# Patient Record
Sex: Male | Born: 1990 | Race: White | Hispanic: No | Marital: Single | Smoking: Current every day smoker
Health system: Southern US, Community
[De-identification: ages and names within clinical notes are randomized; demographics above are authoritative.]

## PROBLEM LIST (undated history)

## (undated) DIAGNOSIS — J45909 Unspecified asthma, uncomplicated: Secondary | ICD-10-CM

---
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
## 2013-01-11 ENCOUNTER — Emergency Department: Payer: Self-pay | Admitting: Internal Medicine

## 2014-08-16 ENCOUNTER — Emergency Department: Payer: Self-pay | Admitting: Emergency Medicine

## 2016-02-07 ENCOUNTER — Emergency Department
Admission: EM | Admit: 2016-02-07 | Discharge: 2016-02-07 | Disposition: A | Discharge status: Home / Self Care | Payer: No Typology Code available for payment source | Attending: Emergency Medicine | Admitting: Emergency Medicine

## 2016-02-07 ENCOUNTER — Emergency Department: Payer: No Typology Code available for payment source

## 2016-02-07 ENCOUNTER — Encounter: Payer: Self-pay | Admitting: Medical Oncology

## 2016-02-07 DIAGNOSIS — S80812A Abrasion, left lower leg, initial encounter: Secondary | ICD-10-CM | POA: Insufficient documentation

## 2016-02-07 DIAGNOSIS — S301XXA Contusion of abdominal wall, initial encounter: Secondary | ICD-10-CM | POA: Insufficient documentation

## 2016-02-07 DIAGNOSIS — Y9389 Activity, other specified: Secondary | ICD-10-CM | POA: Insufficient documentation

## 2016-02-07 DIAGNOSIS — T07XXXA Unspecified multiple injuries, initial encounter: Secondary | ICD-10-CM

## 2016-02-07 DIAGNOSIS — S7011XA Contusion of right thigh, initial encounter: Secondary | ICD-10-CM | POA: Insufficient documentation

## 2016-02-07 DIAGNOSIS — Y92480 Sidewalk as the place of occurrence of the external cause: Secondary | ICD-10-CM | POA: Insufficient documentation

## 2016-02-07 DIAGNOSIS — F17200 Nicotine dependence, unspecified, uncomplicated: Secondary | ICD-10-CM | POA: Insufficient documentation

## 2016-02-07 DIAGNOSIS — S8001XA Contusion of right knee, initial encounter: Secondary | ICD-10-CM | POA: Insufficient documentation

## 2016-02-07 DIAGNOSIS — S7001XA Contusion of right hip, initial encounter: Secondary | ICD-10-CM | POA: Diagnosis not present

## 2016-02-07 DIAGNOSIS — Y999 Unspecified external cause status: Secondary | ICD-10-CM | POA: Insufficient documentation

## 2016-02-07 DIAGNOSIS — S8991XA Unspecified injury of right lower leg, initial encounter: Secondary | ICD-10-CM | POA: Diagnosis present

## 2016-02-07 MED ORDER — IBUPROFEN 800 MG PO TABS: 800.0000 mg | ORAL_TABLET | Freq: Three times a day (TID) | ORAL | Status: AC | PRN

## 2016-02-07 MED ORDER — BACITRACIN ZINC 500 UNIT/GM EX OINT
TOPICAL_OINTMENT | CUTANEOUS | Status: AC
  Administered 2016-02-07: 1 via TOPICAL
  Filled 2016-02-07: qty 1.8

## 2016-02-07 MED ORDER — CYCLOBENZAPRINE HCL 10 MG PO TABS: 10.0000 mg | ORAL_TABLET | Freq: Three times a day (TID) | ORAL | Status: AC | PRN

## 2016-02-07 MED ORDER — BACITRACIN ZINC 500 UNIT/GM EX OINT
TOPICAL_OINTMENT | Freq: Two times a day (BID) | CUTANEOUS | Status: DC
  Administered 2016-02-07: 1 via TOPICAL

## 2016-02-07 MED ORDER — HYDROMORPHONE HCL 1 MG/ML IJ SOLN
1.0000 mg | Freq: Once | INTRAMUSCULAR | Status: AC
  Administered 2016-02-07: 1 mg via INTRAMUSCULAR
  Filled 2016-02-07: qty 1

## 2016-02-07 MED ORDER — NEOMYCIN-POLYMYXIN-PRAMOXINE 1 % EX CREA: TOPICAL_CREAM | Freq: Two times a day (BID) | CUTANEOUS | Status: AC

## 2016-02-07 MED ORDER — OXYCODONE-ACETAMINOPHEN 7.5-325 MG PO TABS: 1.0000 | ORAL_TABLET | ORAL | Status: AC | PRN
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
## 2016-02-07 NOTE — ED Notes (Signed)
See triage note  Abrasions to hand and chest    Having increased pain to right hip and leg

## 2016-02-07 NOTE — ED Notes (Signed)
Gauze bandages applied to abrasions after Bacitracin application.
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
## 2020-04-12 ENCOUNTER — Emergency Department
Admission: EM | Admit: 2020-04-12 | Discharge: 2020-04-12 | Disposition: A | Discharge status: Home / Self Care | Payer: Self-pay | Attending: Emergency Medicine | Admitting: Emergency Medicine

## 2020-04-12 ENCOUNTER — Other Ambulatory Visit: Payer: Self-pay

## 2020-04-12 DIAGNOSIS — G43909 Migraine, unspecified, not intractable, without status migrainosus: Secondary | ICD-10-CM | POA: Insufficient documentation

## 2020-04-12 DIAGNOSIS — Z5321 Procedure and treatment not carried out due to patient leaving prior to being seen by health care provider: Secondary | ICD-10-CM | POA: Insufficient documentation

## 2020-04-12 NOTE — ED Notes (Signed)
Pt called in waiting room at this time to be taken to room, no response staff unable to locate pt at this time

## 2020-04-12 NOTE — ED Notes (Signed)
Pt called for room, pt not in lobby, or outside.

## 2020-04-12 NOTE — ED Triage Notes (Signed)
Pt comes in POV for migraine since last week. Has tried tylenol, ibuprofen, BC powder, oxycodone, and mom's migraine meds. Went to [facility] and they rx for sinus infection but has not had any relief. Pain all over.

## 2024-03-17 ENCOUNTER — Emergency Department
Admission: EM | Admit: 2024-03-17 | Discharge: 2024-03-17 | Disposition: A | Discharge status: Home / Self Care | Payer: Self-pay | Attending: Emergency Medicine | Admitting: Emergency Medicine

## 2024-03-17 ENCOUNTER — Emergency Department: Payer: Self-pay

## 2024-03-17 ENCOUNTER — Other Ambulatory Visit: Payer: Self-pay

## 2024-03-17 DIAGNOSIS — J069 Acute upper respiratory infection, unspecified: Secondary | ICD-10-CM | POA: Insufficient documentation

## 2024-03-17 DIAGNOSIS — Z720 Tobacco use: Secondary | ICD-10-CM | POA: Insufficient documentation

## 2024-03-17 DIAGNOSIS — J4521 Mild intermittent asthma with (acute) exacerbation: Secondary | ICD-10-CM | POA: Insufficient documentation

## 2024-03-17 HISTORY — DX: Unspecified asthma, uncomplicated: J45.909

## 2024-03-17 LAB — COMPREHENSIVE METABOLIC PANEL WITH GFR
ALT: 10 U/L (ref 0–44)
AST: 21 U/L (ref 15–41)
Albumin: 4.3 g/dL (ref 3.5–5.0)
Alkaline Phosphatase: 68 U/L (ref 38–126)
Anion gap: 7 (ref 5–15)
BUN: 16 mg/dL (ref 6–20)
CO2: 24 mmol/L (ref 22–32)
Calcium: 9.3 mg/dL (ref 8.9–10.3)
Chloride: 106 mmol/L (ref 98–111)
Creatinine, Ser: 0.83 mg/dL (ref 0.61–1.24)
GFR, Estimated: >60 mL/min (ref >60)
Glucose, Bld: 94 mg/dL (ref 70–99)
Potassium: 5 mmol/L (ref 3.5–5.1)
Sodium: 137 mmol/L (ref 135–145)
Total Bilirubin: 1.6 mg/dL — ABNORMAL HIGH (ref 0.0–1.2)
Total Protein: 7.7 g/dL (ref 6.5–8.1)

## 2024-03-17 LAB — CBC
HCT: 41.2 % (ref 39.0–52.0)
Hemoglobin: 14.3 g/dL (ref 13.0–17.0)
MCH: 31.1 pg (ref 26.0–34.0)
MCHC: 34.7 g/dL (ref 30.0–36.0)
MCV: 89.6 fL (ref 80.0–100.0)
Platelets: 256 10*3/uL (ref 150–400)
RBC: 4.6 MIL/uL (ref 4.22–5.81)
RDW: 11.9 % (ref 11.5–15.5)
WBC: 6.3 10*3/uL (ref 4.0–10.5)
nRBC: 0 % (ref 0.0–0.2)

## 2024-03-17 MED ORDER — PREDNISONE 20 MG PO TABS
60.0000 mg | ORAL_TABLET | Freq: Once | ORAL | Status: AC
  Administered 2024-03-17: 60 mg via ORAL
  Filled 2024-03-17: qty 3

## 2024-03-17 MED ORDER — DOXYCYCLINE HYCLATE 100 MG PO TABS
100.0000 mg | ORAL_TABLET | Freq: Once | ORAL | Status: AC
  Administered 2024-03-17: 100 mg via ORAL
  Filled 2024-03-17: qty 1

## 2024-03-17 MED ORDER — IPRATROPIUM-ALBUTEROL 0.5-2.5 (3) MG/3ML IN SOLN
6.0000 mL | Freq: Once | RESPIRATORY_TRACT | Status: AC
  Administered 2024-03-17: 6 mL via RESPIRATORY_TRACT
  Filled 2024-03-17: qty 6

## 2024-03-17 MED ORDER — DOXYCYCLINE MONOHYDRATE 100 MG PO TABS: 100.0000 mg | ORAL_TABLET | Freq: Two times a day (BID) | ORAL | 0 refills | Status: AC

## 2024-03-17 MED ORDER — PREDNISONE 50 MG PO TABS: 50.0000 mg | ORAL_TABLET | Freq: Every day | ORAL | 0 refills | Status: AC

## 2024-03-17 MED ORDER — ALBUTEROL SULFATE HFA 108 (90 BASE) MCG/ACT IN AERS: 2.0000 | INHALATION_SPRAY | Freq: Four times a day (QID) | RESPIRATORY_TRACT | 2 refills | Status: AC | PRN

## 2024-03-17 NOTE — ED Provider Notes (Signed)
 [facility] Provider Note    Event Date/Time   First MD Initiated Contact with Patient 03/17/24 1547     (approximate)   History   Cough   HPI  Brandon Beasley is a 33 y.o. male past medical history significant for asthma, tobacco use, who presents to the emergency department with shortness of breath.  States that for the past 1 week he has been having shortness of breath and feeling like he cannot catch his breath.  Using his sisters inhaler without significant improvement of symptoms.  Denies fever or chills.  Denies nausea or vomiting.  Does endorse a cough over the past 1 week that has been nonproductive.  No history of DVT or PE.  Endorses daily tobacco use.  Does not have his own inhaler.     Physical Exam   Triage Vital Signs: ED Triage Vitals [03/17/24 1403]  Encounter Vitals Group     BP (!) 133/91     Girls Systolic BP Percentile      Girls Diastolic BP Percentile      Boys Systolic BP Percentile      Boys Diastolic BP Percentile      Pulse Rate 96     Resp 17     Temp 98.5 F (36.9 C)     Temp Source Oral     SpO2 97 %     Weight 126 lb (57.2 kg)     Height 6' (1.829 m)     Head Circumference      Peak Flow      Pain Score 0     Pain Loc      Pain Education      Exclude from Growth Chart     Most recent vital signs: Vitals:   03/17/24 1403  BP: (!) 133/91  Pulse: 96  Resp: 17  Temp: 98.5 F (36.9 C)  SpO2: 97%    Physical Exam Constitutional:      Appearance: He is well-developed.  HENT:     Head: Atraumatic.   Eyes:     Conjunctiva/sclera: Conjunctivae normal.    Cardiovascular:     Rate and Rhythm: Regular rhythm.     Pulses: Normal pulses.  Pulmonary:     Effort: Respiratory distress present.     Breath sounds: Wheezing present.     Comments: Speaking in 3 word sentences.  Diffuse inspiratory and expiratory wheezing throughout all lung fields. Abdominal:     Tenderness: There is no abdominal  tenderness.   Musculoskeletal:     Cervical back: Normal range of motion.     Right lower leg: No edema.     Left lower leg: No edema.   Skin:    General: Skin is warm.     Capillary Refill: Capillary refill takes less than 2 seconds.   Neurological:     General: No focal deficit present.     Mental Status: He is alert. Mental status is at baseline.      IMPRESSION / MDM / ASSESSMENT AND PLAN / ED COURSE  I reviewed the triage vital signs and the nursing notes.  Differential diagnosis including asthma exacerbation, pneumonia, atypical pneumonia, anemia.  Low risk Wells criteria and PERC negative have low suspicion for pulmonary embolism.   RADIOLOGY I independently reviewed imaging, my interpretation of imaging: Chest x-ray with no focal findings consistent with pneumonia.   Labs (all labs ordered are listed, but only abnormal results are displayed) Labs interpreted as -  Labs Reviewed  COMPREHENSIVE METABOLIC PANEL WITH GFR - Abnormal; Notable for the following components:      Result Value   Total Bilirubin 1.6 (*)    All other components within normal limits  CBC    Patient with diffuse wheezing on exam.  Clinical picture is most consistent with asthma exacerbation.  No focal findings consistent with pneumonia but does have possible atypical pneumonia.  Given DuoNeb treatment and Solu-Medrol.  Started on doxycycline.  Will reevaluate after DuoNeb treatment  Discussed  smoking cessation  Ambulated with a pulse ox of 100%.  Feeling much better after butyryl treatment.  Given a referral for primary care physician.  Discussed return precautions for any worsening symptoms     PROCEDURES:  Critical Care performed: No  Procedures  Patient's presentation is most consistent with acute presentation with potential threat to life or bodily function.   MEDICATIONS ORDERED IN ED: Medications  ipratropium-albuterol (DUONEB) 0.5-2.5 (3) MG/3ML nebulizer solution 6 mL  (6 mLs Nebulization Given 03/17/24 1644)  predniSONE (DELTASONE) tablet 60 mg (60 mg Oral Given 03/17/24 1644)  doxycycline (VIBRA-TABS) tablet 100 mg (100 mg Oral Given 03/17/24 1644)    FINAL CLINICAL IMPRESSION(S) / ED DIAGNOSES   Final diagnoses:  Viral URI with cough  Exacerbation of intermittent asthma, unspecified asthma severity     Rx / DC Orders   ED Discharge Orders          Ordered    Ambulatory Referral to Primary Care (Establish Care)        03/17/24 1612    predniSONE (DELTASONE) 50 MG tablet  Daily with breakfast        03/17/24 1612    doxycycline (ADOXA) 100 MG tablet  2 times daily        03/17/24 1612    albuterol (VENTOLIN HFA) 108 (90 Base) MCG/ACT inhaler  Every 6 hours PRN        03/17/24 1612             Note:  This document was prepared using Dragon voice recognition software and may include unintentional dictation errors.   Suzanne Kirsch, MD 03/17/24 1721

## 2024-03-17 NOTE — Discharge Instructions (Addendum)
 You were seen in the emergency department for an asthma exacerbation.  Your chest x-ray did not show an obvious pneumonia.  You were started on steroids, given an albuterol inhaler, use albuterol inhaler 2 to 4 puffs every 4 hours for shortness of breath.  You are also started on antibiotic.  Is important that you stop smoking.  Follow-up with a primary care physician and return to the emergency department for any worsening symptoms.  Prednisone -you are given a prescription for a steroid.  It is important that you take this medication with food.  This medication can cause an upset stomach.   Doxycycline - This medication can cause acid reflux.  It is important that you take it with food and drink plenty of water.  Do not lie down for 1 hour after taking this medication.  It also causes sun sensitivity so stay out of the sun or wear SPF while on this medication.

## 2024-03-17 NOTE — ED Notes (Signed)
 Ambulatory O2 sat: Seated pt at 96% with pulse 86. Pt walked the circumference of the flex department. O2 sat increased to 100% and pulse climbed to 94. Provider notified.

## 2024-03-17 NOTE — ED Triage Notes (Signed)
 Pt sts that he thinks that he has pneumonia. Pt sts that it has been going on for the last week.
